# Patient Record
Sex: Male | Born: 1960 | Race: White | Hispanic: No | Marital: Married | Smoking: Never smoker
Health system: Southern US, Community
[De-identification: ages and names within clinical notes are randomized; demographics above are authoritative.]

## PROBLEM LIST (undated history)

## (undated) DIAGNOSIS — E7800 Pure hypercholesterolemia, unspecified: Secondary | ICD-10-CM

## (undated) DIAGNOSIS — Z87442 Personal history of urinary calculi: Secondary | ICD-10-CM

## (undated) DIAGNOSIS — M109 Gout, unspecified: Secondary | ICD-10-CM

## (undated) HISTORY — PX: HERNIA REPAIR: SHX51

## (undated) HISTORY — PX: APPENDECTOMY: SHX54

---

## 2014-10-20 HISTORY — PX: VASECTOMY: SHX75

## 2016-03-17 ENCOUNTER — Encounter (HOSPITAL_COMMUNITY): Payer: Self-pay | Admitting: Emergency Medicine

## 2016-03-17 ENCOUNTER — Emergency Department (HOSPITAL_COMMUNITY)
Admission: EM | Admit: 2016-03-17 | Discharge: 2016-03-17 | Disposition: A | Discharge status: Home / Self Care | Payer: PRIVATE HEALTH INSURANCE | Attending: Emergency Medicine | Admitting: Emergency Medicine

## 2016-03-17 DIAGNOSIS — M109 Gout, unspecified: Secondary | ICD-10-CM | POA: Diagnosis present

## 2016-03-17 HISTORY — DX: Pure hypercholesterolemia, unspecified: E78.00

## 2016-03-17 HISTORY — DX: Gout, unspecified: M10.9

## 2016-03-17 MED ORDER — COLCHICINE 0.6 MG PO TABS
1.2000 mg | ORAL_TABLET | Freq: Once | ORAL | Status: AC
  Administered 2016-03-17: 1.2 mg via ORAL
  Filled 2016-03-17: qty 2

## 2016-03-17 MED ORDER — COLCHICINE 0.6 MG PO TABS: 0.6000 mg | ORAL_TABLET | Freq: Two times a day (BID) | ORAL | Status: AC

## 2016-03-17 MED ORDER — OXYCODONE-ACETAMINOPHEN 5-325 MG PO TABS
1.0000 | ORAL_TABLET | Freq: Once | ORAL | Status: AC
  Administered 2016-03-17: 1 via ORAL
  Filled 2016-03-17: qty 1

## 2016-03-17 MED ORDER — OXYCODONE-ACETAMINOPHEN 5-325 MG PO TABS: 1.0000 | ORAL_TABLET | ORAL | Status: DC | PRN

## 2016-03-17 NOTE — ED Notes (Signed)
Patient verbalizes understanding of discharge instructions, prescriptions, home care and follow up care. Patient out of department at this time. 

## 2016-03-17 NOTE — Discharge Instructions (Signed)

## 2016-03-17 NOTE — ED Notes (Signed)
Pt states he believes he has gout again.  States is occurs frequently in his left foot and this pain has been increasing for 2 weeks.

## 2016-03-17 NOTE — ED Provider Notes (Signed)
CSN: [id number]     Arrival date & time 03/17/16  1258 History  By signing my name below, I, Mesha Guinyard, attest that this documentation has been prepared under the direction and in the presence of Treatment Team:  Physician Assistant: Burgess Amor, PA-C.  Electronically Signed: Arvilla Market, Medical Scribe. 03/17/2016. 3:03 PM.   Chief Complaint  Patient presents with  . Gout   The history is provided by the patient. No language interpreter was used.   HPI Comments: Timothy Woodard is a 55 y.o. male who presents to the Emergency Department complaining of gout on his left foot onset 2 weeks ago. Pt mentions the pain has been worse over the past 3 days. Pt states he's had intermittent gout since '92 and his current pain is similar to his prior episodes of gout. In the past he was relieved with prednisone, or colchicine by capsule, colchicine working somewhat better. Pt denies injury to foot. Pt has continued his regular activities despite pain.  He states that he has tried oxycodone, and ibuporfen every 4 hours for the past 2 weeks with no relief for his symptoms.    Past Medical History  Diagnosis Date  . Gout   . High cholesterol    History reviewed. No pertinent past surgical history. History reviewed. No pertinent family history. Social History  Substance Use Topics  . Smoking status: Never Smoker   . Smokeless tobacco: None  . Alcohol Use: Yes     Comment: occasional    Review of Systems  Musculoskeletal: Negative for myalgias (lower leg).   Allergies  Review of patient's allergies indicates no known allergies.  Home Medications   Prior to Admission medications   Medication Sig Start Date End Date Taking? Authorizing Provider  colchicine 0.6 MG tablet Take 1 tablet (0.6 mg total) by mouth 2 (two) times daily. 03/17/16   Burgess Amor, PA-C  oxyCODONE-acetaminophen (PERCOCET/ROXICET) 5-325 MG tablet Take 1 tablet by mouth every 4 (four) hours as needed. 03/17/16   Burgess Amor,  PA-C   BP 149/91 mmHg  Pulse 73  Temp(Src) 97.6 F (36.4 C) (Temporal)  Resp 16  Ht  (1.727 m)  Wt 92.987 kg  BMI 31.18 kg/m2  SpO2 98% Physical Exam  Constitutional: He appears well-developed and well-nourished. No distress.  HENT:  Head: Normocephalic and atraumatic.  Eyes: Conjunctivae are normal.  Neck: Neck supple.  Cardiovascular: Normal rate.   Pulmonary/Chest: Effort normal.  Musculoskeletal:  Pt has mild erythema and edema of left dorsal foot. Warm but not hot to touch. Distal sensation is intact Pedal pulses are full No red streaking No pain in ankle or lower leg  Neurological: He is alert.  Skin: Skin is warm and dry.  Psychiatric: He has a normal mood and affect. His behavior is normal.  Nursing note and vitals reviewed.   ED Course  Procedures  DIAGNOSTIC STUDIES: Oxygen Saturation is 98% on RA, NL by my interpretation.    COORDINATION OF CARE: 5:12 PM Discussed treatment plan with pt at bedside and pt agreed to plan.  Labs Review Labs Reviewed - No data to display  Imaging Review No results found. I have personally reviewed and evaluated these images and lab results as part of my medical decision-making.   EKG Interpretation None      MDM   Final diagnoses:  Acute gout of left foot, unspecified cause    Patient with exam and history consistent with gout.  He has had no injuries or  traumas to his foot.  Skin is intact.  Doubt cellulitis.  He was placed on colchicine and prescribed oxycodone for pain relief.  He was encouraged to cut back on activity, elevating foot and using warm compresses.  He endorses having been fairly active including mowing his lawn.  I recommended that he rest the foot while the gout settles down.  He was given referrals for establishing a primary doctor here locally.  I personally performed the services described in this documentation, which was scribed in my presence. The recorded information has been reviewed and  is accurate.   Burgess AmorJulie Guadalupe Kerekes, PA-C 03/17/16 1718  Bethann BerkshireJoseph Zammit, MD 03/18/16 [phone]

## 2016-05-05 ENCOUNTER — Encounter (HOSPITAL_COMMUNITY): Payer: Self-pay | Admitting: Emergency Medicine

## 2016-05-05 ENCOUNTER — Emergency Department (HOSPITAL_COMMUNITY)
Admission: EM | Admit: 2016-05-05 | Discharge: 2016-05-05 | Disposition: A | Discharge status: Home / Self Care | Payer: PRIVATE HEALTH INSURANCE | Attending: Emergency Medicine | Admitting: Emergency Medicine

## 2016-05-05 DIAGNOSIS — R21 Rash and other nonspecific skin eruption: Secondary | ICD-10-CM | POA: Diagnosis present

## 2016-05-05 DIAGNOSIS — L237 Allergic contact dermatitis due to plants, except food: Secondary | ICD-10-CM | POA: Insufficient documentation

## 2016-05-05 DIAGNOSIS — Z79899 Other long term (current) drug therapy: Secondary | ICD-10-CM | POA: Diagnosis not present

## 2016-05-05 MED ORDER — PREDNISONE 10 MG (21) PO TBPK: 10.0000 mg | ORAL_TABLET | Freq: Every day | ORAL | Status: DC

## 2016-05-05 NOTE — Discharge Instructions (Signed)
Continue to use the TransMontaigneCaladryl Lotion. Take the steroids as directed. Take Benadryl as needed for itching. Stay as cool as possible. Return for worsening symptoms.   Contact Dermatitis Dermatitis is redness, soreness, and swelling (inflammation) of the skin. Contact dermatitis is a reaction to certain substances that touch the skin. You either touched something that irritated your skin, or you have allergies to something you touched.  HOME CARE  Skin Care  Moisturize your skin as needed.  Apply cool compresses to the affected areas.   Try taking a bath with:   Epsom salts. Follow the instructions on the package. You can get these at a pharmacy or grocery store.   Baking soda. Pour a small amount into the bath as told by your doctor.   Colloidal oatmeal. Follow the instructions on the package. You can get this at a pharmacy or grocery store.   Try applying baking soda paste to your skin. Stir water into baking soda until it looks like paste.  Do not scratch your skin.   Bathe less often.  Bathe in lukewarm water. Avoid using hot water.  Medicines  Take or apply over-the-counter and prescription medicines only as told by your doctor.   If you were prescribed an antibiotic medicine, take or apply your antibiotic as told by your doctor. Do not stop taking the antibiotic even if your condition starts to get better. General Instructions  Keep all follow-up visits as told by your doctor. This is important.   Avoid the substance that caused your reaction. If you do not know what caused it, keep a journal to try to track what caused it. Write down:   What you eat.   What cosmetic products you use.   What you drink.   What you wear in the affected area. This includes jewelry.   If you were given a bandage (dressing), take care of it as told by your doctor. This includes when to change and remove it.  GET HELP IF:   You do not get better with treatment.   Your  condition gets worse.   You have signs of infection such as:  Swelling.  Tenderness.  Redness.  Soreness.  Warmth.   You have a fever.   You have new symptoms.  GET HELP RIGHT AWAY IF:   You have a very bad headache.  You have neck pain.  Your neck is stiff.   You throw up (vomit).   You feel very sleepy.   You see red streaks coming from the affected area.   Your bone or joint underneath the affected area becomes painful after the skin has healed.   The affected area turns darker.   You have trouble breathing.    This information is not intended to replace advice given to you by your health care provider. Make sure you discuss any questions you have with your health care provider.   Document Released: 08/03/2009 Document Revised: 06/27/2015 Document Reviewed: 02/21/2015 Elsevier Interactive Patient Education [employer]2016 Elsevier Inc.

## 2016-05-05 NOTE — ED Provider Notes (Signed)
CSN: [id number]     Arrival date & time 05/05/16  1140 History   None    Chief Complaint  Patient presents with  . Rash    The history is provided by the patient. No language interpreter was used.    HPI Comments: Timothy Woodard is a 55 y.o. male who presents to the Emergency Department complaining of multiple pruritic and erythematous areas located on his forearms onset 04/24/2016. Pt states cut a bush on 04/24/2016 and noticed an area of erythema on his left forearm 5 days later. Pt states he has used calamine lotion with no relief to symptoms as well as [employer] itch relief cream. Pt states he has not tried to use Benadryl to ease the itching. He states that he avoids scratching or touching the areas as much as possible. Pt states he plans on travelling out of the country on 05/18/2016 and was hoping to have the areas to clear up before his flight. He denies respiratory symptoms or swelling of his throat.    Past Medical History  Diagnosis Date  . Gout   . High cholesterol    Past Surgical History  Procedure Laterality Date  . Appendectomy     History reviewed. No pertinent family history. Social History  Substance Use Topics  . Smoking status: Never Smoker   . Smokeless tobacco: None  . Alcohol Use: Yes     Comment: occasional    Review of Systems  Respiratory: Negative for shortness of breath.   Skin: Positive for color change and rash.  All other systems reviewed and are negative.     Allergies  Review of patient's allergies indicates no known allergies.  Home Medications   Prior to Admission medications   Medication Sig Start Date End Date Taking? Authorizing Provider  colchicine 0.6 MG tablet Take 1 tablet (0.6 mg total) by mouth 2 (two) times daily. 03/17/16   Burgess Amor, PA-C  oxyCODONE-acetaminophen (PERCOCET/ROXICET) 5-325 MG tablet Take 1 tablet by mouth every 4 (four) hours as needed. 03/17/16   Burgess Amor, PA-C  predniSONE (STERAPRED UNI-PAK 21  TAB) 10 MG (21) TBPK tablet Take 1 tablet (10 mg total) by mouth daily. Take 6 tabs by mouth daily  for 1 day, then 5 tabs for 1 day, then 4 tabs for 1  day, then 3 tabs for 1 day, 2 tabs for 1 day, then 1 tab by mouth 05/05/16   Hope Orlene Och, NP   BP 157/91 mmHg  Pulse 62  Temp(Src) 97.9 F (36.6 C) (Oral)  Resp 16  Ht  (1.727 m)  Wt 90.719 kg  BMI 30.42 kg/m2  SpO2 96% Physical Exam  Constitutional: He appears well-developed and well-nourished. No distress.  HENT:  Head: Normocephalic and atraumatic.  Mouth/Throat: Uvula is midline, oropharynx is clear and moist and mucous membranes are normal.  Eyes: Conjunctivae and EOM are normal.  Neck: Neck supple. No tracheal deviation present.  Cardiovascular: Normal rate and regular rhythm.   Murmur heard. Pulmonary/Chest: Effort normal and breath sounds normal. No respiratory distress.  Abdominal: He exhibits no distension.  Musculoskeletal: Normal range of motion. He exhibits no edema.  See skin exam  Neurological: He is alert.  Skin: Skin is warm and dry. Rash noted. There is erythema.  Multiple vesicular lesion bilateral forearms. No drainage, red streaking or signs of infection.   Psychiatric: He has a normal mood and affect. His behavior is normal.  Nursing note and vitals reviewed.  ED Course  Procedures (including critical care time)  DIAGNOSTIC STUDIES: Oxygen Saturation is 96% on RA, adequate by my interpretation.    COORDINATION OF CARE: 11:56 AM Discussed treatment plan with pt at bedside which includes Prednisone and pt agreed to plan.   MDM  55 y.o. male with rash to forearms stable for d/c without respiratory symptoms and no signs of infection. Will treat with prednisone and patient will continue Calamine lotion and will take Benadryl as needed for itching. Discussed with the patient and all questioned fully answered. He will return if any problems arise.   Final diagnoses:  Contact dermatitis due to poison  oak   I personally performed the services described in this documentation, which was scribed in my presence. The recorded information has been reviewed and is accurate.    LongwoodHope M Neese, [place]NP 05/05/16 1424  Blane OharaJoshua Zavitz, MD 05/06/16 [phone]

## 2016-05-05 NOTE — ED Notes (Signed)
Patient states he was working outside a couple of weeks ago and has had rash noted to bilateral forearms x 1 week. Denies pain, complains of itching to areas.

## 2017-07-17 ENCOUNTER — Emergency Department (HOSPITAL_COMMUNITY): Payer: PRIVATE HEALTH INSURANCE

## 2017-07-17 ENCOUNTER — Encounter (HOSPITAL_COMMUNITY): Payer: Self-pay

## 2017-07-17 ENCOUNTER — Emergency Department (HOSPITAL_COMMUNITY)
Admission: EM | Admit: 2017-07-17 | Discharge: 2017-07-17 | Disposition: A | Discharge status: Home / Self Care | Payer: PRIVATE HEALTH INSURANCE | Attending: Emergency Medicine | Admitting: Emergency Medicine

## 2017-07-17 DIAGNOSIS — N23 Unspecified renal colic: Secondary | ICD-10-CM

## 2017-07-17 DIAGNOSIS — R1084 Generalized abdominal pain: Secondary | ICD-10-CM | POA: Diagnosis present

## 2017-07-17 DIAGNOSIS — R110 Nausea: Secondary | ICD-10-CM | POA: Diagnosis not present

## 2017-07-17 LAB — URINALYSIS, ROUTINE W REFLEX MICROSCOPIC
BACTERIA UA: NONE SEEN
BILIRUBIN URINE: NEGATIVE
Glucose, UA: NEGATIVE mg/dL
Ketones, ur: NEGATIVE mg/dL
LEUKOCYTES UA: NEGATIVE
NITRITE: NEGATIVE
Protein, ur: NEGATIVE mg/dL
SPECIFIC GRAVITY, URINE: 1.011 (ref 1.005–1.030)
Squamous Epithelial / LPF: NONE SEEN
pH: 6 (ref 5.0–8.0)

## 2017-07-17 LAB — CBC WITH DIFFERENTIAL/PLATELET
Basophils Absolute: 0 10*3/uL (ref 0.0–0.1)
Basophils Relative: 0 %
EOS ABS: 0.1 10*3/uL (ref 0.0–0.7)
Eosinophils Relative: 1 %
HCT: 43 % (ref 39.0–52.0)
Hemoglobin: 15.1 g/dL (ref 13.0–17.0)
LYMPHS ABS: 1.8 10*3/uL (ref 0.7–4.0)
Lymphocytes Relative: 16 %
MCH: 31.2 pg (ref 26.0–34.0)
MCHC: 35.1 g/dL (ref 30.0–36.0)
MCV: 88.8 fL (ref 78.0–100.0)
MONO ABS: 0.4 10*3/uL (ref 0.1–1.0)
MONOS PCT: 4 %
Neutro Abs: 8.7 10*3/uL — ABNORMAL HIGH (ref 1.7–7.7)
Neutrophils Relative %: 79 %
PLATELETS: 174 10*3/uL (ref 150–400)
RBC: 4.84 MIL/uL (ref 4.22–5.81)
RDW: 13.4 % (ref 11.5–15.5)
WBC: 11 10*3/uL — AB (ref 4.0–10.5)

## 2017-07-17 LAB — BASIC METABOLIC PANEL
Anion gap: 9 (ref 5–15)
BUN: 20 mg/dL (ref 6–20)
CO2: 25 mmol/L (ref 22–32)
CREATININE: 1.24 mg/dL (ref 0.61–1.24)
Calcium: 9.5 mg/dL (ref 8.9–10.3)
Chloride: 105 mmol/L (ref 101–111)
GFR calc Af Amer: >60 mL/min (ref >60)
GLUCOSE: 123 mg/dL — AB (ref 65–99)
Potassium: 4.2 mmol/L (ref 3.5–5.1)
SODIUM: 139 mmol/L (ref 135–145)

## 2017-07-17 MED ORDER — IBUPROFEN 800 MG PO TABS: 800.0000 mg | ORAL_TABLET | Freq: Three times a day (TID) | ORAL | 0 refills | Status: DC | PRN

## 2017-07-17 MED ORDER — ONDANSETRON HCL 4 MG/2ML IJ SOLN
4.0000 mg | Freq: Once | INTRAMUSCULAR | Status: AC
  Administered 2017-07-17: 4 mg via INTRAVENOUS
  Filled 2017-07-17: qty 2

## 2017-07-17 MED ORDER — ONDANSETRON 4 MG PO TBDP: 4.0000 mg | ORAL_TABLET | Freq: Three times a day (TID) | ORAL | 0 refills | Status: AC | PRN

## 2017-07-17 MED ORDER — KETOROLAC TROMETHAMINE 30 MG/ML IJ SOLN
30.0000 mg | Freq: Once | INTRAMUSCULAR | Status: AC
  Administered 2017-07-17: 30 mg via INTRAVENOUS
  Filled 2017-07-17: qty 1

## 2017-07-17 MED ORDER — TAMSULOSIN HCL 0.4 MG PO CAPS: 0.4000 mg | ORAL_CAPSULE | Freq: Every day | ORAL | 0 refills | Status: AC

## 2017-07-17 MED ORDER — SODIUM CHLORIDE 0.9 % IV BOLUS (SEPSIS)
1000.0000 mL | Freq: Once | INTRAVENOUS | Status: AC
  Administered 2017-07-17: 1000 mL via INTRAVENOUS

## 2017-07-17 MED ORDER — OXYCODONE-ACETAMINOPHEN 5-325 MG PO TABS: 1.0000 | ORAL_TABLET | ORAL | 0 refills | Status: AC | PRN

## 2017-07-17 NOTE — ED Provider Notes (Signed)
[facility]-EMERGENCY DEPT Provider Note   CSN: [id number] Arrival date & time: 07/17/17  0153     History   Chief Complaint Chief Complaint  Patient presents with  . Flank Pain    HPI Timothy Woodard is a 56 y.o. male.  Patient presents with left-sided flank pain it radiates to his groin onset about 10:30 last night. The pain is constant. It is similar episode of pain 3 days ago which resolved on its own. He took ibuprofen at home today without relief. Has had nausea but no vomiting. No fever. No pain with urination or hematuria. Patient with previous appendectomy and remote kidney stone. Denies any diarrhea. Last vomiting was 2 days ago. No chest pain or shortness of breath. No testicular pain.   The history is provided by the patient.  Flank Pain  Pertinent negatives include no chest pain, no abdominal pain, no headaches and no shortness of breath.    Past Medical History:  Diagnosis Date  . Gout   . High cholesterol     There are no active problems to display for this patient.   Past Surgical History:  Procedure Laterality Date  . APPENDECTOMY         Home Medications    Prior to Admission medications   Medication Sig Start Date End Date Taking? Authorizing Provider  colchicine 0.6 MG tablet Take 1 tablet (0.6 mg total) by mouth 2 (two) times daily. 03/17/16   Burgess Amor, PA-C  oxyCODONE-acetaminophen (PERCOCET/ROXICET) 5-325 MG tablet Take 1 tablet by mouth every 4 (four) hours as needed. 03/17/16   Idol, Raynelle Fanning, PA-C  predniSONE (STERAPRED UNI-PAK 21 TAB) 10 MG (21) TBPK tablet Take 1 tablet (10 mg total) by mouth daily. Take 6 tabs by mouth daily  for 1 day, then 5 tabs for 1 day, then 4 tabs for 1  day, then 3 tabs for 1 day, 2 tabs for 1 day, then 1 tab by mouth 05/05/16   Janne Napoleon, NP    Family History No family history on file.  Social History Social History  Substance Use Topics  . Smoking status: Never Smoker  . Smokeless tobacco: Never Used  .  Alcohol use Yes     Comment: occasional     Allergies   Patient has no known allergies.   Review of Systems Review of Systems  Constitutional: Negative for activity change, appetite change and fever.  HENT: Negative for congestion.   Eyes: Negative for visual disturbance.  Respiratory: Negative for cough, chest tightness and shortness of breath.   Cardiovascular: Negative for chest pain.  Gastrointestinal: Positive for nausea. Negative for abdominal pain and vomiting.  Genitourinary: Positive for difficulty urinating, dysuria and flank pain. Negative for hematuria.  Musculoskeletal: Negative for arthralgias and myalgias.  Skin: Negative for rash.  Neurological: Negative for dizziness, weakness, light-headedness and headaches.   all other systems are negative except as noted in the HPI and PMH.     Physical Exam Updated Vital Signs BP (!) 194/104 (BP Location: Right Arm)   Pulse 72   Temp 97.9 F (36.6 C) (Oral)   Resp 20   Ht  (1.727 m)   Wt 93.9 kg (207 lb)   SpO2 100%   BMI 31.47 kg/m   Physical Exam  Constitutional: He is oriented to person, place, and time. He appears well-developed and well-nourished. He appears distressed.  uncomfortable  HENT:  Head: Normocephalic and atraumatic.  Mouth/Throat: Oropharynx is clear and moist. No oropharyngeal exudate.  Eyes: Pupils are equal, round, and reactive to light. Conjunctivae and EOM are normal.  Neck: Normal range of motion. Neck supple.  No meningismus.  Cardiovascular: Normal rate, regular rhythm, normal heart sounds and intact distal pulses.   No murmur heard. Pulmonary/Chest: Effort normal and breath sounds normal. No respiratory distress. He exhibits no tenderness.  Abdominal: Soft. There is tenderness. There is no rebound and no guarding.  L sided abdominal tenderness  Genitourinary:  Genitourinary Comments: No testicular tenderness  Musculoskeletal: Normal range of motion. He exhibits tenderness. He  exhibits no edema.  L CVAT  Neurological: He is alert and oriented to person, place, and time. No cranial nerve deficit. He exhibits normal muscle tone. Coordination normal.   5/5 strength throughout. CN 2-12 intact.Equal grip strength.   Skin: Skin is warm.  Psychiatric: He has a normal mood and affect. His behavior is normal.  Nursing note and vitals reviewed.    ED Treatments / Results  Labs (all labs ordered are listed, but only abnormal results are displayed) Labs Reviewed  URINALYSIS, ROUTINE W REFLEX MICROSCOPIC - Abnormal; Notable for the following:       Result Value   Color, Urine STRAW (*)    Hgb urine dipstick MODERATE (*)    All other components within normal limits  CBC WITH DIFFERENTIAL/PLATELET - Abnormal; Notable for the following:    WBC 11.0 (*)    Neutro Abs 8.7 (*)    All other components within normal limits  BASIC METABOLIC PANEL - Abnormal; Notable for the following:    Glucose, Bld 123 (*)    All other components within normal limits    EKG  EKG Interpretation None       Radiology Ct Renal Stone Study  Result Date: 07/17/2017 CLINICAL DATA:  Left flank pain. EXAM: CT ABDOMEN AND PELVIS WITHOUT CONTRAST TECHNIQUE: Multidetector CT imaging of the abdomen and pelvis was performed following the standard protocol without IV contrast. COMPARISON:  None. FINDINGS: Lower chest: Linear right lower lobe atelectasis. Minimal coronary artery calcifications. Hepatobiliary: Decreased hepatic density consistent with steatosis. No focal hepatic lesion. Gallbladder physiologically distended, no calcified stone. No biliary dilatation. Pancreas: No ductal dilatation or inflammation. Spleen: Normal in size without focal abnormality. Adrenals/Urinary Tract: No adrenal nodule. Obstructing 4 mm stone in the left proximal ureter just beyond the ureteropelvic junction with moderate hydronephrosis and perinephric edema. Ureter distal to this is decompressed. No additional  nonobstructing stones in the left kidney. No right hydronephrosis or urolithiasis Urinary bladder is minimally distended, no bladder stone. Stomach/Bowel: Stomach is within normal limits. Appendix is surgically absent per history. No evidence of bowel wall thickening, distention, or inflammatory changes. Vascular/Lymphatic: Minimal aortic atherosclerosis without aneurysm. No abdominal or pelvic adenopathy. Reproductive: Prostate is unremarkable. Other: No free air, free fluid, or intra-abdominal fluid collection. Minimal fat in the inguinal canals. Musculoskeletal: There are no acute or suspicious osseous abnormalities. Degenerative change in the spine. IMPRESSION: 1. Obstructing 4 mm stone in the left proximal ureter with moderate hydronephrosis and perinephric edema. 2. Mild hepatic steatosis. 3.  Aortic Atherosclerosis (ICD10-I70.0). Electronically Signed   By: Rubye Oaks M.D.   On: 07/17/2017 03:20    Procedures Procedures (including critical care time)  Medications Ordered in ED Medications  sodium chloride 0.9 % bolus 1,000 mL (1,000 mLs Intravenous New Bag/Given 07/17/17 0242)  ketorolac (TORADOL) 30 MG/ML injection 30 mg (30 mg Intravenous Given 07/17/17 0242)  ondansetron (ZOFRAN) injection 4 mg (4 mg Intravenous Given 07/17/17 0242)  Initial Impression / Assessment and Plan / ED Course  I have reviewed the triage vital signs and the nursing notes.  Pertinent labs & imaging results that were available during my care of the patient were reviewed by me and considered in my medical decision making (see chart for details).     Left flank pain it radiates to groin, suspicious for kidney stone. No testicular pain.  Patient treated for suspected kidney stone. Given IV fluids, pain and nausea medication. Urinalysis shows hematuria without infection.  CT scan confirms 4 mm proximal left ureteral stone with hydronephrosis.  Patient drove himself and does not want to have narcotics.  His pain is well-controlled after only Toradol.  Results discussed with patient. He is anxious to go home. His pain and nausea are controlled. Discussed with patient urology follow-up. Return to the ED with worsening symptoms including fever, worsening pain or vomiting. Final Clinical Impressions(s) / ED Diagnoses   Final diagnoses:  Ureteral colic    New Prescriptions New Prescriptions   No medications on file     Glynn Octave, MD 07/17/17 [phone]

## 2017-07-17 NOTE — ED Triage Notes (Signed)
Pt reports left flank pain that radiates into the left groin onset 2230

## 2017-07-17 NOTE — Discharge Instructions (Signed)
Take the pain medicines as prescribed. Follow-up with the urologist. Return to the ED if you develop worsening pain, fever, vomiting or any other concerns.

## 2017-07-21 ENCOUNTER — Ambulatory Visit (INDEPENDENT_AMBULATORY_CARE_PROVIDER_SITE_OTHER): Payer: PRIVATE HEALTH INSURANCE | Admitting: Urology

## 2017-07-21 ENCOUNTER — Other Ambulatory Visit: Payer: Self-pay | Admitting: Urology

## 2017-07-21 ENCOUNTER — Other Ambulatory Visit (HOSPITAL_COMMUNITY): Payer: Self-pay | Admitting: Family Medicine

## 2017-07-21 ENCOUNTER — Ambulatory Visit (HOSPITAL_COMMUNITY)
Admission: RE | Admit: 2017-07-21 | Discharge: 2017-07-21 | Disposition: A | Discharge status: Home / Self Care | Payer: PRIVATE HEALTH INSURANCE | Source: Ambulatory Visit | Attending: Family Medicine | Admitting: Family Medicine

## 2017-07-21 DIAGNOSIS — N201 Calculus of ureter: Secondary | ICD-10-CM

## 2017-07-22 ENCOUNTER — Other Ambulatory Visit: Payer: Self-pay | Admitting: Urology

## 2017-07-24 ENCOUNTER — Encounter (HOSPITAL_COMMUNITY): Payer: Self-pay | Admitting: General Practice

## 2017-07-26 NOTE — H&P (Signed)
[facility] Office Visit     07/21/2017   --------------------------------------------------------------------------------   Timothy Woodard  MRN: [id number]  PRIMARY CARE:  Georgann Housekeeper, MD  DOB: 11/02/1960, 56 year old Male  REFERRING:    SSN:   PROVIDER:  Marcine Matar, M.D.    LOCATION:  [facility] [phone]   --------------------------------------------------------------------------------   CC: I have kidney stones.  HPI: Timothy Woodard is a 56 year-old male patient who is here for renal calculi.  The problem is on the left side. He first stated noticing pain on 07/16/2017. This is not his first kidney stone. He has had 1 stones prior to getting this one. He is currently having flank pain, back pain, and nausea. He denies having groin pain, vomiting, fever, and chills.   56 year old male presents with a left-sided ureteral stone. History of one kidney stone quite a few years ago passed spontaneously. He became symptomatic late on 07/16/2017 with flank pain. He presented to the emergency room here at [facility] and was diagnosed with a 3.7 mm left proximal ureteral stone. He is at persistent pain since then. He is alternated between oxycodone and ibuprofen. He has not had fever or chills. He has not had blood in his urine. He is currently experiencing significant pain.     ALLERGIES: None   MEDICATIONS: Flomax 0.4 mg capsule, ext release 24 hr  Ibuprofen  Oxycodone Hcl     GU PSH: Vasectomy - 07/22/2015    NON-GU PSH: Appendectomy - 1997    GU PMH: None   NON-GU PMH: Gout Hypercholesterolemia    FAMILY HISTORY: None   SOCIAL HISTORY: Marital Status: Married Preferred Language: English; Ethnicity: Not Hispanic Or Latino; Race: American [place] or [place] Native Current Smoking Status: Patient does not smoke anymore. Has not smoked since 07/20/1996. Smoked for 15 years. Smoked less than 1/2 pack per day.   Tobacco Use Assessment Completed:  Used Tobacco in last 30 days? Drinks 3 drinks per month.  Drinks 4+ caffeinated drinks per day. Patient's occupation Hydrographic surveyor.    REVIEW OF SYSTEMS:    GU Review Male:   Patient denies frequent urination, hard to postpone urination, burning/ pain with urination, get up at night to urinate, leakage of urine, stream starts and stops, trouble starting your stream, have to strain to urinate , erection problems, and penile pain.  Gastrointestinal (Upper):   Patient reports nausea. Patient denies vomiting and indigestion/ heartburn.  Gastrointestinal (Lower):   Patient denies diarrhea and constipation.  Constitutional:   Patient denies night sweats, fatigue, fever, and weight loss.  Skin:   Patient denies skin rash/ lesion and itching.  Eyes:   Patient denies blurred vision and double vision.  Ears/ Nose/ Throat:   Patient denies sore throat and sinus problems.  Hematologic/Lymphatic:   Patient denies swollen glands and easy bruising.  Cardiovascular:   Patient denies leg swelling and chest pains.  Respiratory:   Patient denies cough and shortness of breath.  Endocrine:   Patient denies excessive thirst.  Musculoskeletal:   Patient denies back pain and joint pain.  Neurological:   Patient denies headaches and dizziness.  Psychologic:   Patient denies depression and anxiety.   VITAL SIGNS:      07/21/2017 08:44 AM  Weight 207 lb / 93.89 kg  Height 68 in / 172.72 cm  BP 141/86 mmHg  Pulse 80 /min  Temperature 98.4 F / 36.8 C  BMI 31.5 kg/m   MULTI-SYSTEM PHYSICAL EXAMINATION:  Constitutional: Well-nourished. No physical deformities. Normally developed. Good grooming. In significant pain.  Neck: Neck symmetrical, not swollen. Normal tracheal position.  Respiratory: No labored breathing, no use of accessory muscles.   Skin: No paleness, no jaundice, no cyanosis. No lesion, no ulcer, no rash.  Neurologic / Psychiatric: Oriented to time, oriented to place, oriented to person. No  depression, no anxiety, no agitation.  Gastrointestinal: No mass, no tenderness, no rigidity, non obese abdomen.  Eyes: Normal conjunctivae. Normal eyelids.  Ears, Nose, Mouth, and Throat: Left ear no scars, no lesions, no masses. Right ear no scars, no lesions, no masses. Nose no scars, no lesions, no masses. Normal hearing. Normal lips.  Musculoskeletal: Normal gait and station of head and neck.     PAST DATA REVIEWED:  Source Of History:  Patient  Urine Test Review:   Urinalysis  X-Ray Review: [facility] Protocol: Reviewed Films. Reviewed Report. Discussed With Patient. 3.7 left proximal ureteral stone with mild hydronephrosis. No other renal calculi noted.    PROCEDURES:          Urinalysis - 81003 Dipstick Dipstick Cont'd  Specimen: Voided Bilirubin: 1+  Color: Yellow Ketones: 3+  Appearance: Clear Blood: Trace Lysed  Specific Gravity: >= 1.030 Protein: Neg  pH: 5.0 Urobilinogen: 0.2  Glucose: Neg Nitrites: Neg    Leukocyte Esterase: Neg         Ketoralac  - [id number], [id number] Area was prepped with alcohol. Medication was injected. Pt tolerated well.   Qty: 60 Adm. By: Gustavus Messing  Unit: mg Lot No [id number]  Route: IM Exp. Date 02/17/2018  Freq: None Mfgr.:   Site: Left Buttock   ASSESSMENT:      ICD-10 Details  1 GU:   Ureteral calculus - N20.1 3.9 mm left proximal ureteral stone. He is now comfortable having had an injection of Toradol.   PLAN:            Medications New Meds: Ketorolac Tromethamine 10 mg tablet 1 tablet PO Q 8 H prn pain   #15  1 Refill(s)            Orders X-Rays: KUB          Schedule Return Visit/Planned Activity: Other See Visit Notes - Office Visit, F/U Pending Results             Note: we'll call w/ results and followup          Document Letter(s):  Created for Patient: Clinical Summary         Notes:    1. Prescription for Toradol given   2. KUB today   3. I did discuss treatment with him-medical expulsive therapy, shockwave  lithotripsy and ureteroscopy with holmium laser lithotripsy and extraction. If the stone is moving distally, have some time to buy. If not, consider treatment, we will discuss following KUB    * Signed by Marcine Matar, M.D. on 07/21/17 at 9:55 AM (EDT)*     The information contained in this medical record document is considered private and confidential patient information. This information can only be used for the medical diagnosis and/or medical services that are being provided by the patient's selected caregivers. This information can only be distributed outside of the patient's care if the patient agrees and signs waivers of authorization for this information to be sent to an outside source or route.

## 2017-07-27 ENCOUNTER — Encounter (HOSPITAL_COMMUNITY): Payer: Self-pay | Admitting: General Practice

## 2017-07-27 ENCOUNTER — Ambulatory Visit (HOSPITAL_COMMUNITY): Payer: PRIVATE HEALTH INSURANCE

## 2017-07-27 ENCOUNTER — Ambulatory Visit (HOSPITAL_COMMUNITY)
Admission: RE | Admit: 2017-07-27 | Discharge: 2017-07-27 | Disposition: A | Discharge status: Home / Self Care | Payer: PRIVATE HEALTH INSURANCE | Source: Ambulatory Visit | Attending: Urology | Admitting: Urology

## 2017-07-27 ENCOUNTER — Encounter (HOSPITAL_COMMUNITY)
Admission: RE | Disposition: A | Discharge status: Home / Self Care | Payer: Self-pay | Source: Ambulatory Visit | Attending: Urology

## 2017-07-27 DIAGNOSIS — N201 Calculus of ureter: Secondary | ICD-10-CM

## 2017-07-27 DIAGNOSIS — Z79899 Other long term (current) drug therapy: Secondary | ICD-10-CM | POA: Diagnosis not present

## 2017-07-27 DIAGNOSIS — Z87891 Personal history of nicotine dependence: Secondary | ICD-10-CM | POA: Insufficient documentation

## 2017-07-27 DIAGNOSIS — N132 Hydronephrosis with renal and ureteral calculous obstruction: Secondary | ICD-10-CM | POA: Insufficient documentation

## 2017-07-27 DIAGNOSIS — Z7901 Long term (current) use of anticoagulants: Secondary | ICD-10-CM | POA: Insufficient documentation

## 2017-07-27 DIAGNOSIS — N202 Calculus of kidney with calculus of ureter: Secondary | ICD-10-CM | POA: Diagnosis present

## 2017-07-27 HISTORY — PX: EXTRACORPOREAL SHOCK WAVE LITHOTRIPSY: SHX1557

## 2017-07-27 HISTORY — DX: Personal history of urinary calculi: Z87.442

## 2017-07-27 SURGERY — LITHOTRIPSY, ESWL
Anesthesia: LOCAL | Laterality: Left

## 2017-07-27 MED ORDER — DIPHENHYDRAMINE HCL 25 MG PO CAPS
25.0000 mg | ORAL_CAPSULE | ORAL | Status: AC
  Administered 2017-07-27: 25 mg via ORAL
  Filled 2017-07-27: qty 1

## 2017-07-27 MED ORDER — LEVOFLOXACIN IN D5W 500 MG/100ML IV SOLN
500.0000 mg | INTRAVENOUS | Status: AC
  Administered 2017-07-27: 500 mg via INTRAVENOUS
  Filled 2017-07-27 (×2): qty 100

## 2017-07-27 MED ORDER — SODIUM CHLORIDE 0.9 % IV SOLN
INTRAVENOUS | Status: DC
  Administered 2017-07-27: 08:00:00 via INTRAVENOUS

## 2017-07-27 MED ORDER — DIAZEPAM 5 MG PO TABS
10.0000 mg | ORAL_TABLET | ORAL | Status: AC
  Administered 2017-07-27: 10 mg via ORAL
  Filled 2017-07-27: qty 2

## 2017-07-27 NOTE — Op Note (Signed)
See [facility] OP note scanned into chart. 

## 2017-07-27 NOTE — Discharge Instructions (Signed)
See [facility] discharge instructions in chart. May restart toradol Wednesday 10-10

## 2017-07-27 NOTE — Interval H&P Note (Signed)
History and Physical Interval Note:  07/27/2017 8:49 AM  Timothy Woodard  has presented today for surgery, with the diagnosis of LEFT URETERAL STONE  The various methods of treatment have been discussed with the patient and family. After consideration of risks, benefits and other options for treatment, the patient has consented to  Procedure(s): LEFT EXTRACORPOREAL SHOCK WAVE LITHOTRIPSY (ESWL) (Left) as a surgical intervention .  The patient's history has been reviewed, patient examined, no change in status, stable for surgery.  I have reviewed the patient's chart and labs.  Questions were answered to the patient's satisfaction.     Valetta Fuller

## 2017-08-24 ENCOUNTER — Other Ambulatory Visit: Payer: Self-pay | Admitting: Urology

## 2017-08-24 ENCOUNTER — Ambulatory Visit (HOSPITAL_COMMUNITY)
Admission: RE | Admit: 2017-08-24 | Discharge: 2017-08-24 | Disposition: A | Discharge status: Home / Self Care | Payer: PRIVATE HEALTH INSURANCE | Source: Ambulatory Visit | Attending: Urology | Admitting: Urology

## 2017-08-24 DIAGNOSIS — N201 Calculus of ureter: Secondary | ICD-10-CM

## 2017-08-24 DIAGNOSIS — N200 Calculus of kidney: Secondary | ICD-10-CM | POA: Diagnosis not present

## 2017-08-25 ENCOUNTER — Ambulatory Visit: Payer: PRIVATE HEALTH INSURANCE | Admitting: Urology

## 2019-05-14 ENCOUNTER — Telehealth: Payer: Self-pay | Admitting: Emergency Medicine

## 2019-05-14 NOTE — Telephone Encounter (Signed)
Patient called regarding COVID 19 testing results.  This RN does not note a COVID panel result listed in chart.  Informed patient who states his family has received theirs.  This RN explained that some tests may not be batched together, and there may be another day or two before his results post.  Patient verbalized understanding.

## 2019-05-20 IMAGING — CT CT RENAL STONE PROTOCOL
2 of 4 series · 16 of 46 positions shown, 18 images · non-contrast
Comparison: None.

CLINICAL DATA: Left flank pain.

EXAM:
CT ABDOMEN AND PELVIS WITHOUT CONTRAST
TECHNIQUE: Multidetector CT imaging of the abdomen and pelvis was performed
following the standard protocol without IV contrast.

[Series 2: axial st · axial · 0.84mm/px · z∈[+1054,+1530]mm · 13 of 107 slices shown, 15 images]
[im 6/107  soft-tissue]
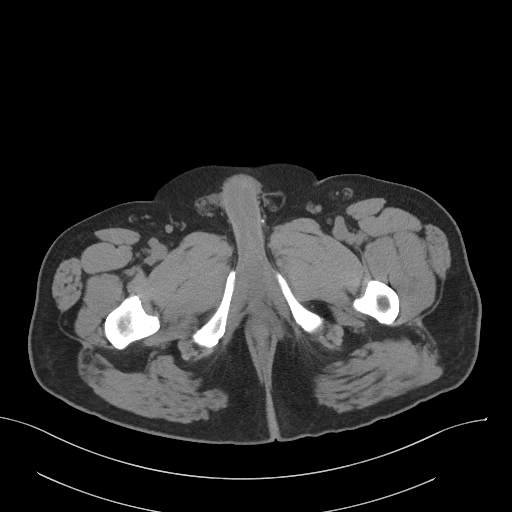
[im 6/107  bone]
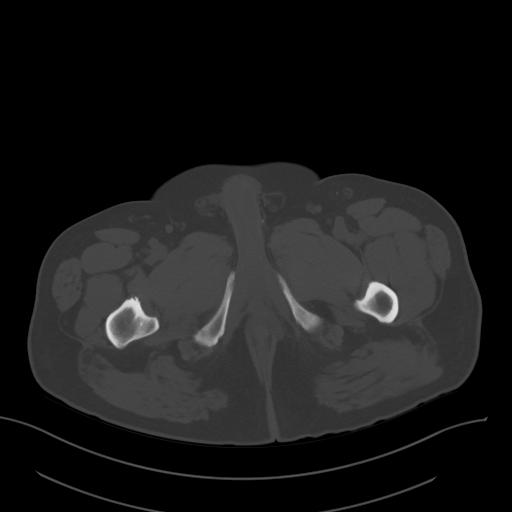
[im 16/107  soft-tissue]
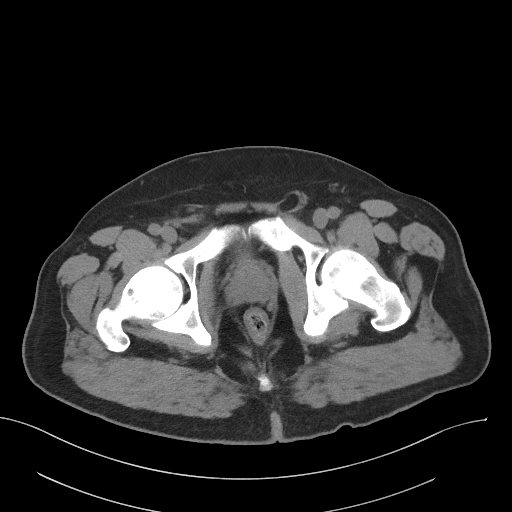
[im 21/107  soft-tissue]
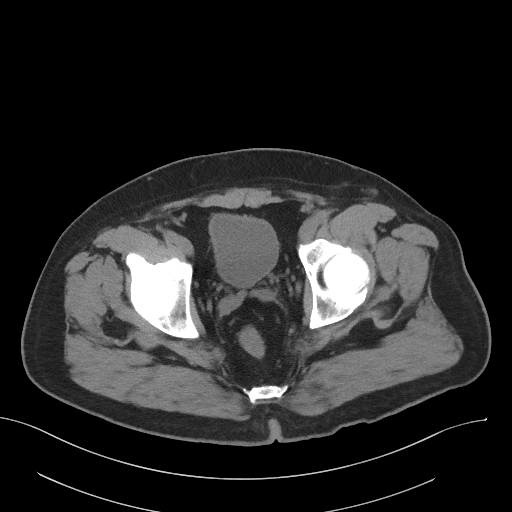
[im 31/107  soft-tissue]
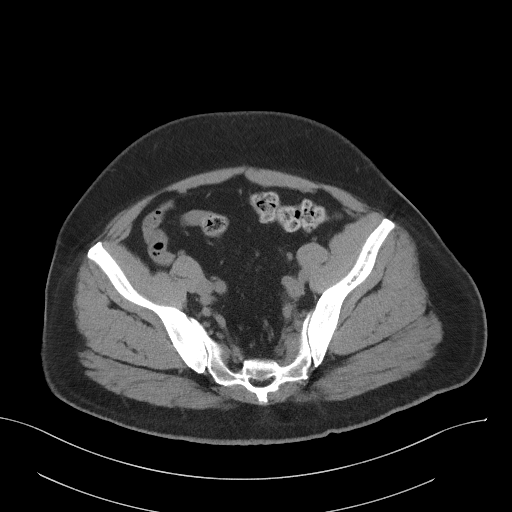
[im 36/107  soft-tissue]
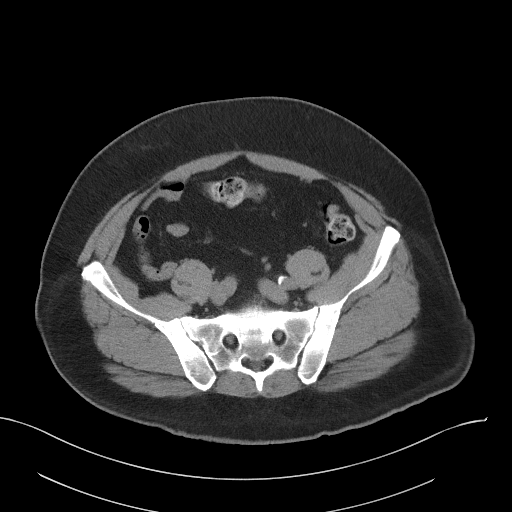
[im 46/107  soft-tissue]
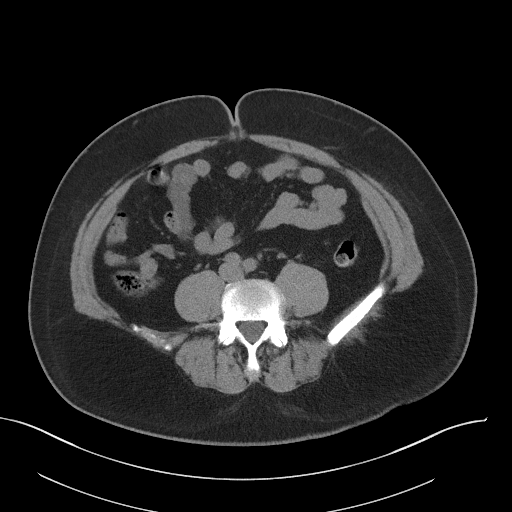
[im 56/107  soft-tissue]
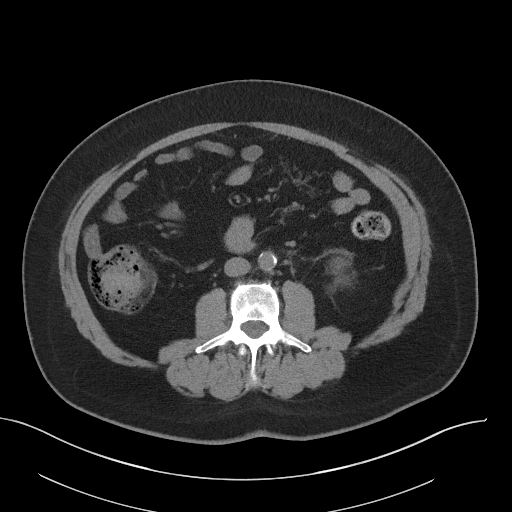
[im 61/107  soft-tissue]
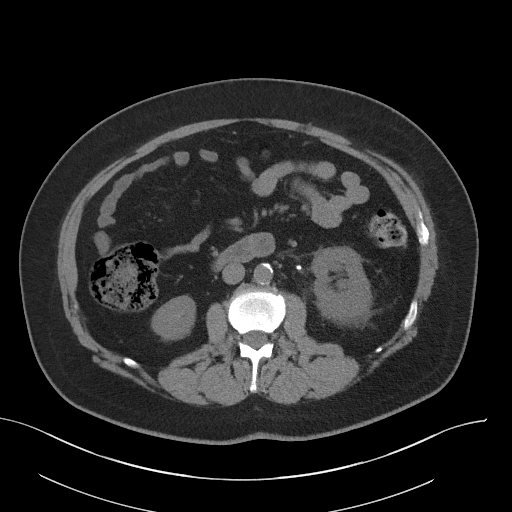
[im 71/107  soft-tissue]
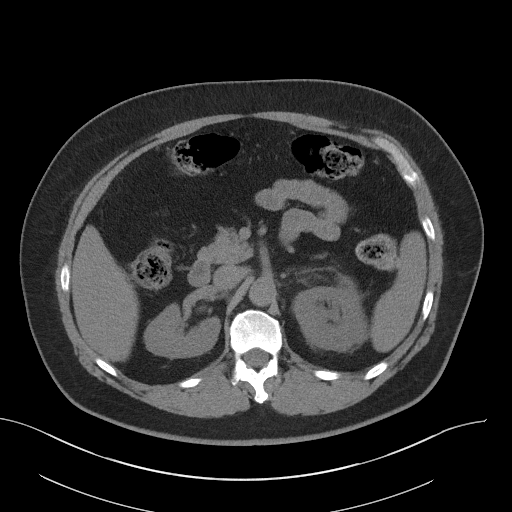
[im 71/107  bone]
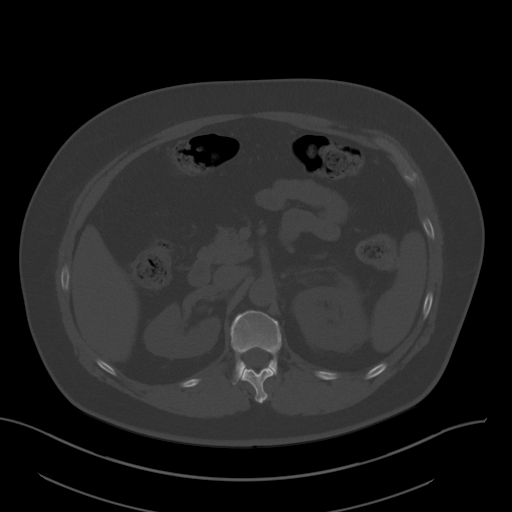
[im 76/107  soft-tissue]
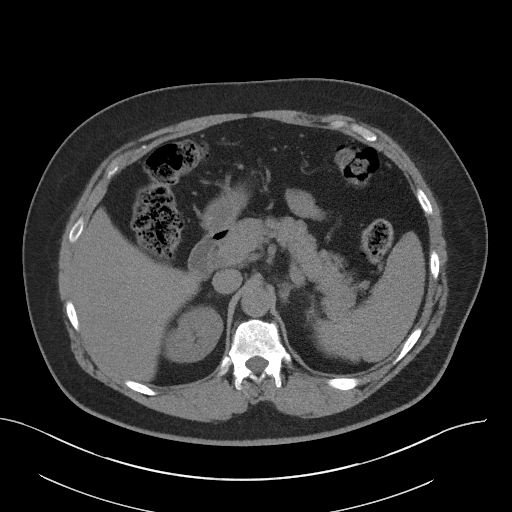
[im 86/107  soft-tissue]
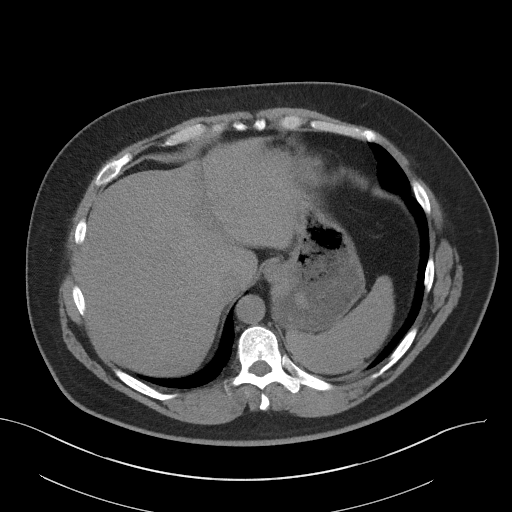
[im 91/107  soft-tissue]
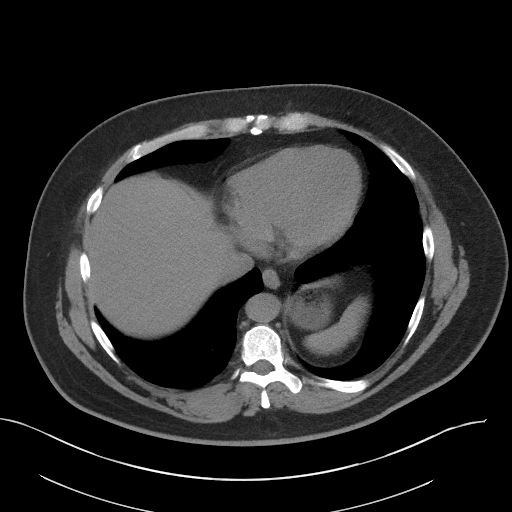
[im 101/107  soft-tissue]
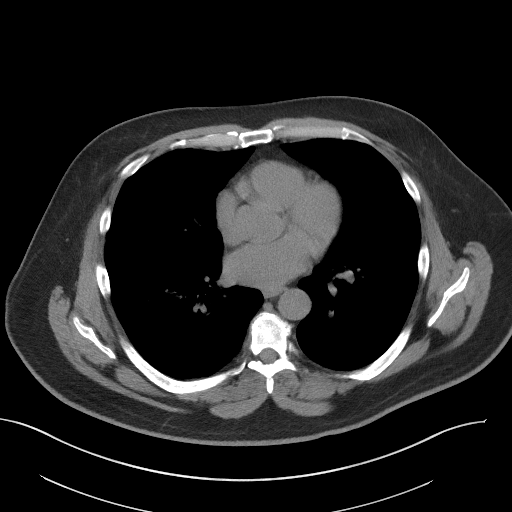

[Series 5: coronal st · coronal · 0.90mm/px · 3 of 101 slices shown]
[im 34/101  soft-tissue]
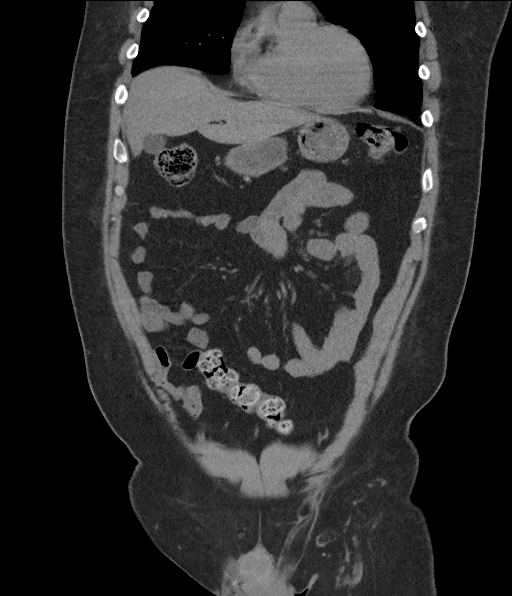
[im 45/101  soft-tissue]
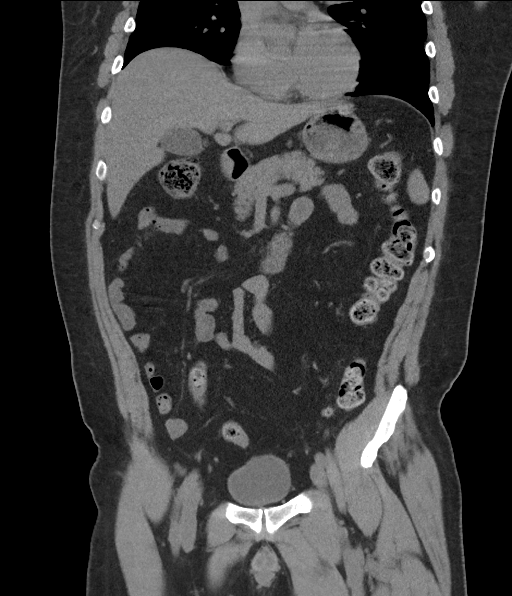
[im 56/101  soft-tissue]
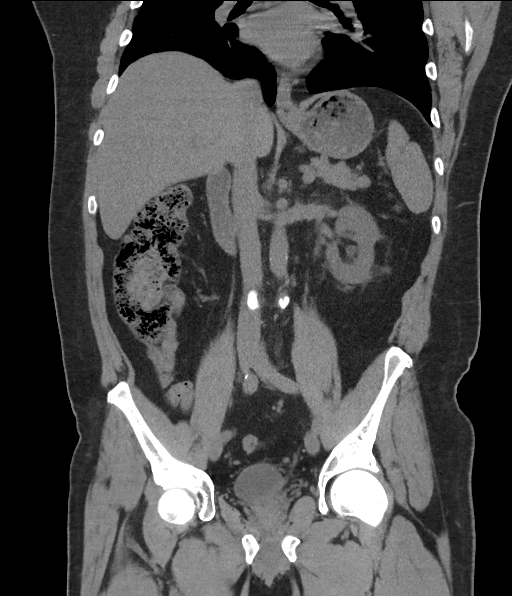

[16 of 46 positions shown; findings below may reference images not displayed]

FINDINGS: Lower chest: Linear right lower lobe atelectasis. Minimal coronary
artery calcifications.

Hepatobiliary: Decreased hepatic density consistent with steatosis.
No focal hepatic lesion. Gallbladder physiologically distended, no
calcified stone. No biliary dilatation.

Pancreas: No ductal dilatation or inflammation.

Spleen: Normal in size without focal abnormality.

Adrenals/Urinary Tract: No adrenal nodule.

Obstructing 4 mm stone in the left proximal ureter just beyond the
ureteropelvic junction with moderate hydronephrosis and perinephric
edema. Ureter distal to this is decompressed. No additional
nonobstructing stones in the left kidney.

No right hydronephrosis or urolithiasis

Urinary bladder is minimally distended, no bladder stone.

Stomach/Bowel: Stomach is within normal limits. Appendix is
surgically absent per history. No evidence of bowel wall thickening,
distention, or inflammatory changes.

Vascular/Lymphatic: Minimal aortic atherosclerosis without aneurysm.
No abdominal or pelvic adenopathy.

Reproductive: Prostate is unremarkable.

Other: No free air, free fluid, or intra-abdominal fluid collection.
Minimal fat in the inguinal canals.

Musculoskeletal: There are no acute or suspicious osseous
abnormalities. Degenerative change in the spine.
IMPRESSION: 1. Obstructing 4 mm stone in the left proximal ureter with moderate
hydronephrosis and perinephric edema.
2. Mild hepatic steatosis.
3.  Aortic Atherosclerosis ([id number]).

## 2019-05-24 IMAGING — DX DG ABDOMEN 1V
1 series · 1 of 1 positions shown · non-contrast
Comparison: CT Abdomen and Pelvis 07/17/2017.

CLINICAL DATA: 56-year-old male with left ureteral calculus and
obstructive uropathy diagnosed on 07/17/2017. Increased pain since
yesterday.

EXAM:
ABDOMEN - 1 VIEW

[abdomen kub]
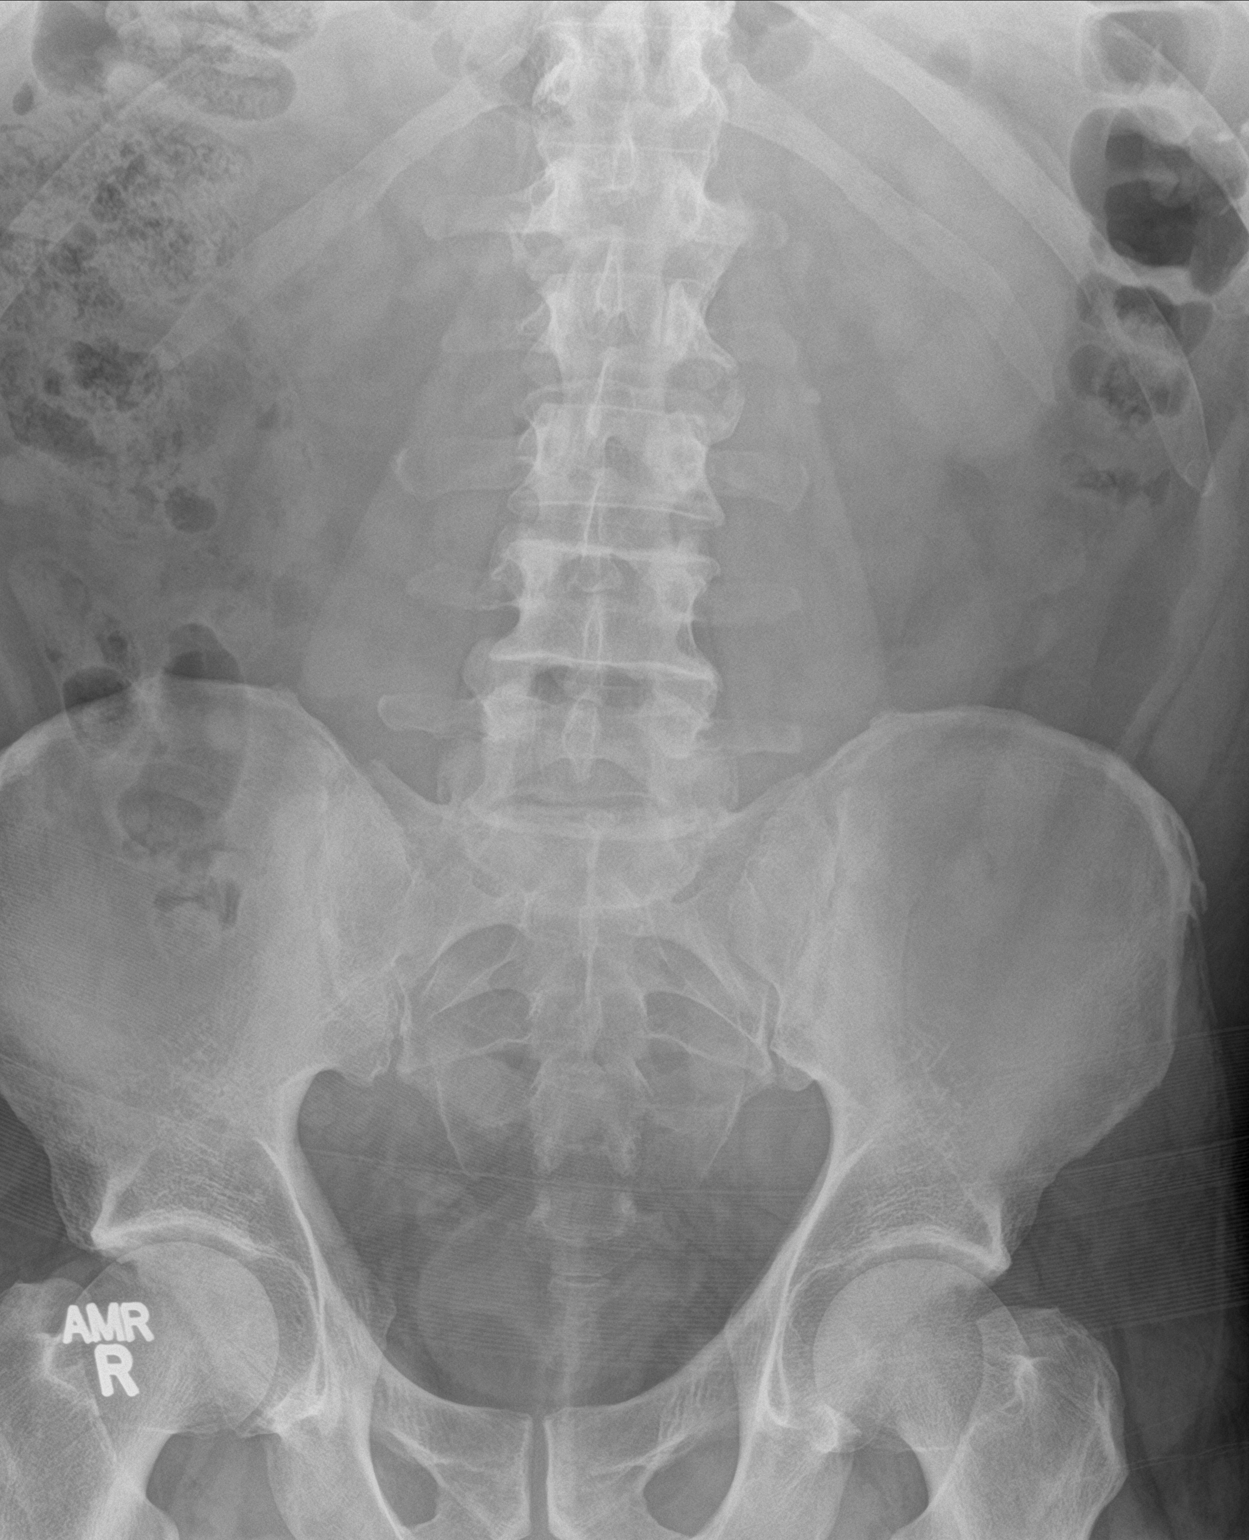

[1 of 1 positions shown; findings below may reference images not displayed]

FINDINGS: 4 mm proximal left ureteral calculus position is unchanged since
07/17/2017, at the L2-L3 level. Normal visible bowel gas pattern.
Stable visualized osseous structures.
IMPRESSION: Unchanged 4 mm proximal left ureteral calculus position since the CT
on 07/17/2017.

## 2019-05-30 IMAGING — CR DG ABDOMEN 1V
2 series · 2 of 2 positions shown · non-contrast
Comparison: KUB July 21, 2017

CLINICAL DATA: Preoperative examination prior to procedure for
left-sided kidney stone.

EXAM:
ABDOMEN - 1 VIEW

[t abdomen supine (1 of 2)]
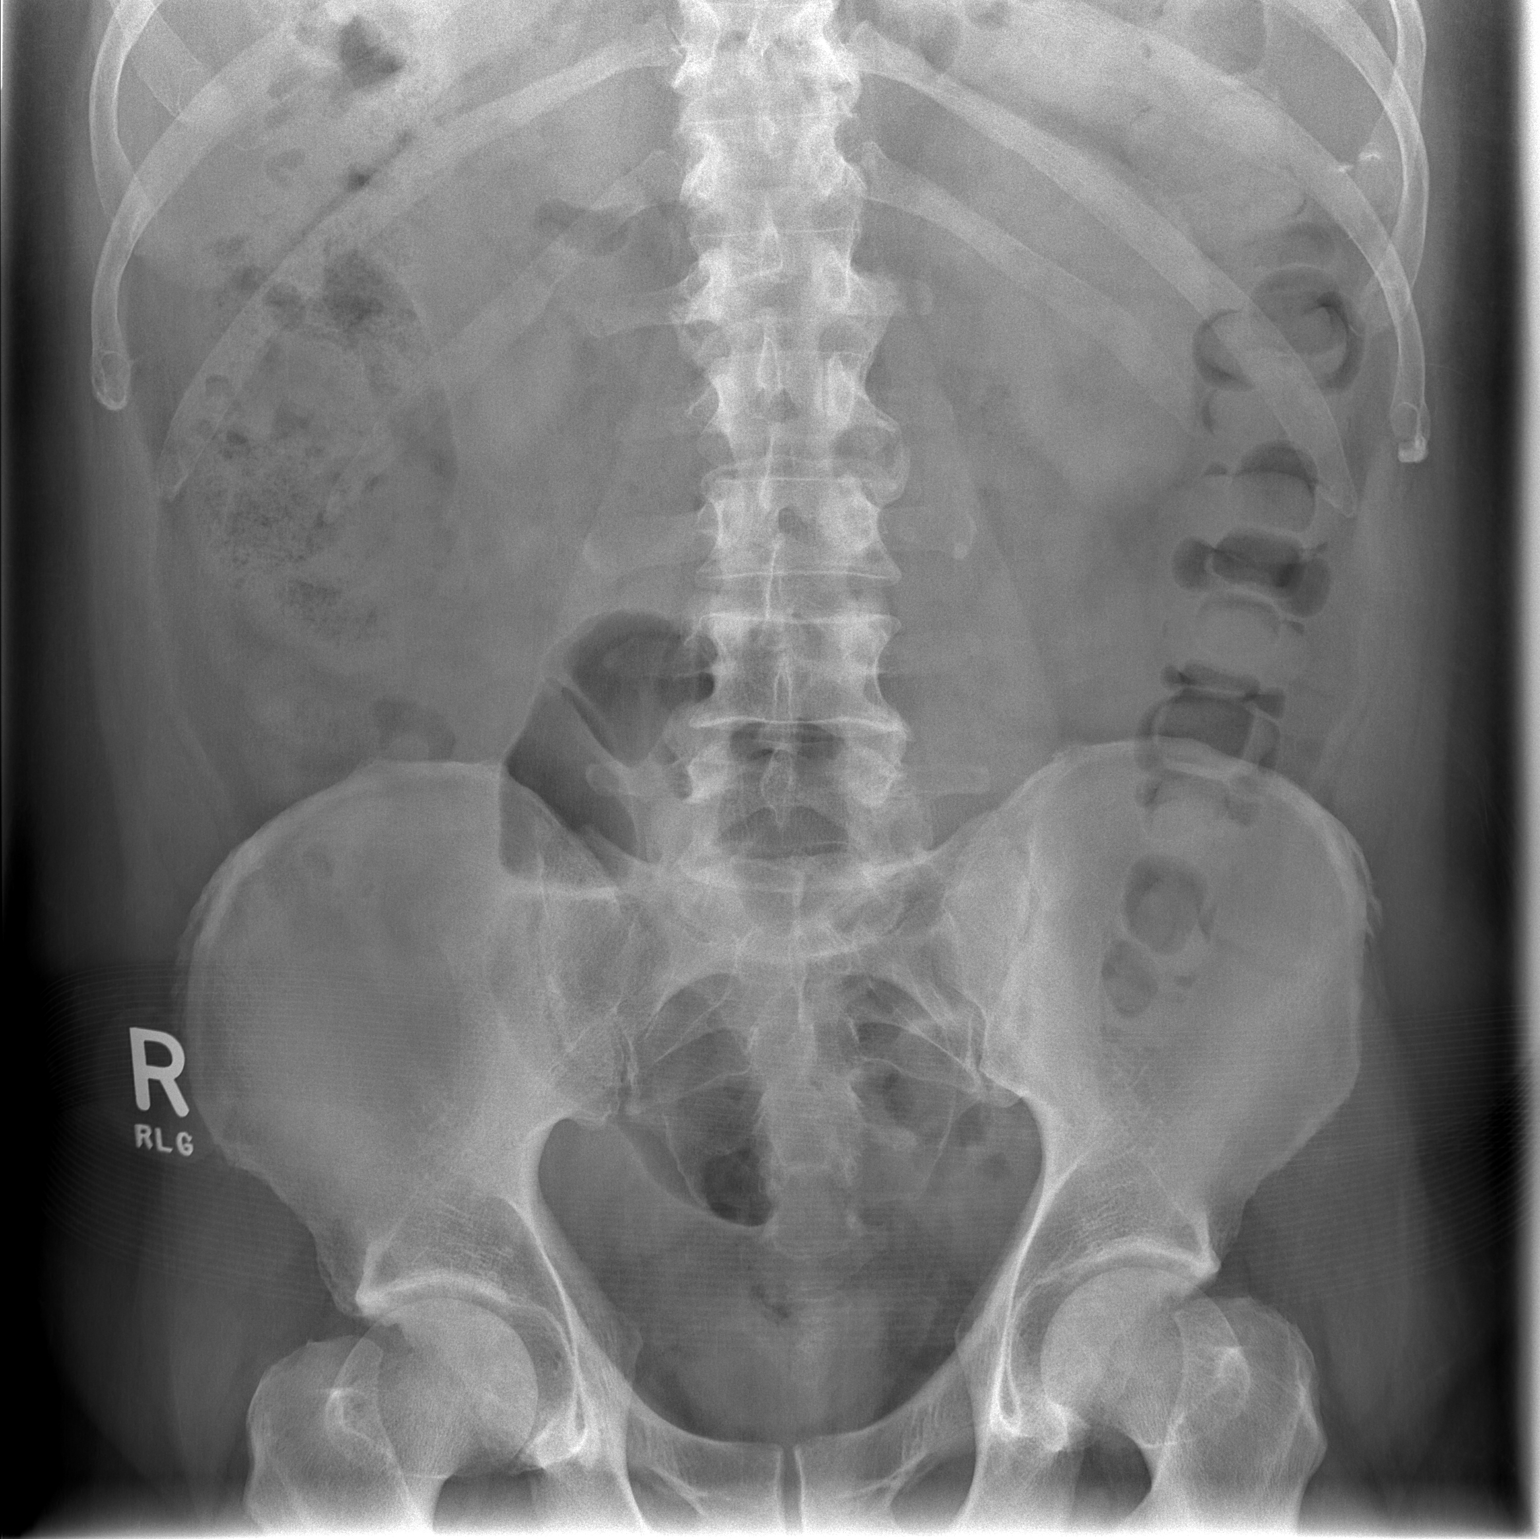

[t abdomen supine (2 of 2)]
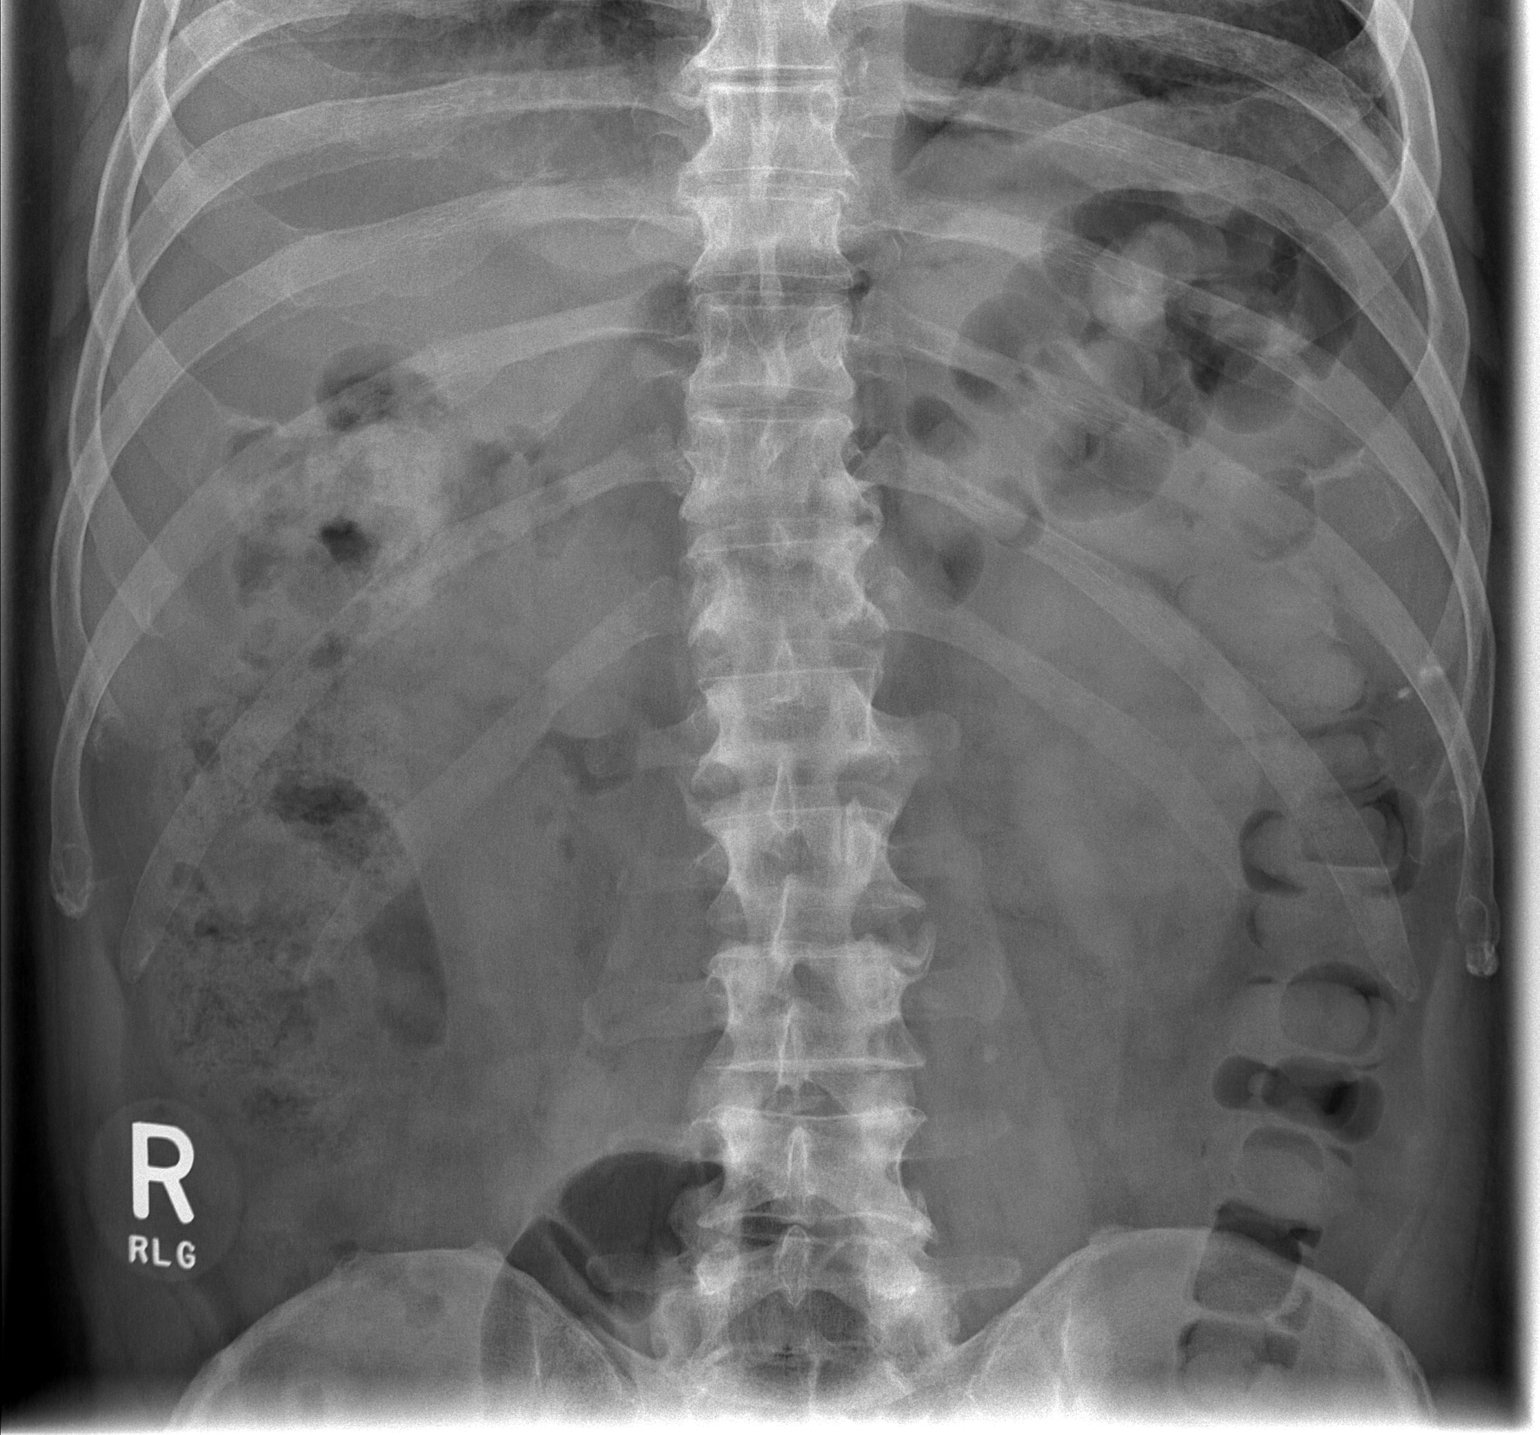

[2 of 2 positions shown; findings below may reference images not displayed]

FINDINGS: There remains an approximately 3 mm diameter calcific density on the
left. It projects at the L3 level in the region of the proximal left
ureter. It has migrated slightly distally since the previous study.
The bowel gas pattern is unremarkable.
IMPRESSION: Proximal right ureteral stone measuring approximately 3 mm in
diameter.

## 2019-07-07 ENCOUNTER — Other Ambulatory Visit: Payer: Self-pay | Admitting: *Deleted

## 2019-07-07 DIAGNOSIS — Z20822 Contact with and (suspected) exposure to covid-19: Secondary | ICD-10-CM

## 2019-07-08 ENCOUNTER — Telehealth: Payer: Self-pay | Admitting: General Practice

## 2019-07-08 LAB — NOVEL CORONAVIRUS, NAA: SARS-CoV-2, NAA: NOT DETECTED

## 2019-07-08 NOTE — Telephone Encounter (Signed)
Negative COVID results given. Patient results "NOT Detected." Caller expressed understanding. ° °
